# Patient Record
Sex: Female | Born: 1995 | Hispanic: No | Marital: Single | Smoking: Never smoker
Health system: Southern US, Community
[De-identification: ages and names within clinical notes are randomized; demographics above are authoritative.]

## PROBLEM LIST (undated history)

## (undated) HISTORY — PX: MANDIBLE FRACTURE SURGERY: SHX706

---

## 2019-12-20 ENCOUNTER — Emergency Department (HOSPITAL_COMMUNITY): Payer: BC Managed Care – PPO

## 2019-12-20 ENCOUNTER — Other Ambulatory Visit: Payer: Self-pay

## 2019-12-20 ENCOUNTER — Encounter (HOSPITAL_COMMUNITY): Payer: Self-pay | Admitting: Emergency Medicine

## 2019-12-20 ENCOUNTER — Emergency Department (HOSPITAL_COMMUNITY)
Admission: EM | Admit: 2019-12-20 | Discharge: 2019-12-20 | Disposition: A | Discharge status: Home / Self Care | Payer: BC Managed Care – PPO | Attending: Emergency Medicine | Admitting: Emergency Medicine

## 2019-12-20 DIAGNOSIS — Z20822 Contact with and (suspected) exposure to covid-19: Secondary | ICD-10-CM | POA: Insufficient documentation

## 2019-12-20 DIAGNOSIS — E860 Dehydration: Secondary | ICD-10-CM | POA: Insufficient documentation

## 2019-12-20 DIAGNOSIS — R55 Syncope and collapse: Secondary | ICD-10-CM

## 2019-12-20 LAB — BASIC METABOLIC PANEL
Anion gap: 8 (ref 5–15)
BUN: 10 mg/dL (ref 6–20)
CO2: 24 mmol/L (ref 22–32)
Calcium: 9.5 mg/dL (ref 8.9–10.3)
Chloride: 108 mmol/L (ref 98–111)
Creatinine, Ser: 0.86 mg/dL (ref 0.44–1.00)
GFR calc Af Amer: >60 mL/min (ref >60)
GFR calc non Af Amer: >60 mL/min (ref >60)
Glucose, Bld: 87 mg/dL (ref 70–99)
Potassium: 3.8 mmol/L (ref 3.5–5.1)
Sodium: 140 mmol/L (ref 135–145)

## 2019-12-20 LAB — CBC
HCT: 44.3 % (ref 36.0–46.0)
Hemoglobin: 14.7 g/dL (ref 12.0–15.0)
MCH: 29.2 pg (ref 26.0–34.0)
MCHC: 33.2 g/dL (ref 30.0–36.0)
MCV: 87.9 fL (ref 80.0–100.0)
Platelets: 300 10*3/uL (ref 150–400)
RBC: 5.04 MIL/uL (ref 3.87–5.11)
RDW: 12.7 % (ref 11.5–15.5)
WBC: 8.3 10*3/uL (ref 4.0–10.5)
nRBC: 0 % (ref 0.0–0.2)

## 2019-12-20 LAB — URINALYSIS, ROUTINE W REFLEX MICROSCOPIC
Bilirubin Urine: NEGATIVE
Glucose, UA: NEGATIVE mg/dL
Hgb urine dipstick: NEGATIVE
Ketones, ur: NEGATIVE mg/dL
Leukocytes,Ua: NEGATIVE
Nitrite: NEGATIVE
Protein, ur: NEGATIVE mg/dL
Specific Gravity, Urine: 1.021 (ref 1.005–1.030)
pH: 7 (ref 5.0–8.0)

## 2019-12-20 LAB — HEPATIC FUNCTION PANEL
ALT: 12 U/L (ref 0–44)
AST: 19 U/L (ref 15–41)
Albumin: 3.9 g/dL (ref 3.5–5.0)
Alkaline Phosphatase: 54 U/L (ref 38–126)
Bilirubin, Direct: <0.1 mg/dL (ref 0.0–0.2)
Total Bilirubin: 0.6 mg/dL (ref 0.3–1.2)
Total Protein: 6.3 g/dL — ABNORMAL LOW (ref 6.5–8.1)

## 2019-12-20 LAB — I-STAT BETA HCG BLOOD, ED (MC, WL, AP ONLY): I-stat hCG, quantitative: <5 m[IU]/mL (ref <5)

## 2019-12-20 LAB — LIPASE, BLOOD: Lipase: 33 U/L (ref 11–51)

## 2019-12-20 LAB — POC SARS CORONAVIRUS 2 AG -  ED: SARS Coronavirus 2 Ag: NEGATIVE

## 2019-12-20 MED ORDER — SODIUM CHLORIDE 0.9% FLUSH: 3.0000 mL | Freq: Once | INTRAVENOUS | Status: DC

## 2019-12-20 MED ORDER — SODIUM CHLORIDE 0.9 % IV BOLUS
1000.0000 mL | Freq: Once | INTRAVENOUS | Status: AC
  Administered 2019-12-20: 19:00:00 1000 mL via INTRAVENOUS

## 2019-12-20 NOTE — ED Notes (Signed)
Pt discharge education provided. COVID education provided. Pt verbalizes understanding. Pt is alert and oriented x4 and ambulatory at discharge.

## 2019-12-20 NOTE — ED Notes (Signed)
Ambulated pt with pulse ox. Pt SpO2 decreased to 98% RA. Pt denies discomfort.

## 2019-12-20 NOTE — Discharge Instructions (Addendum)
You have been diagnosed today with Dehydration, Near Syncope.  At this time there does not appear to be the presence of an emergent medical condition, however there is always the potential for conditions to change. Please read and follow the below instructions.  Please return to the Emergency Department immediately for any new or worsening symptoms. Please be sure to follow up with your Primary Care Provider within one week regarding your visit today; please call their office to schedule an appointment even if you are feeling better for a follow-up visit.  Call the number for [facility] on discharge paperwork to establish a primary care provider if you do not already have one. Please drink plenty of water to avoid dehydration and eat healthy foods.  Please get plenty of rest. Your Covid test is currently pending, you may review your results on your MyChart account in 1-2 days.  Discussed the results with your primary care provider at your follow-up visit this week.  Get help right away if you: Have a seizure. Have pain in your: Chest. Belly (abdomen). Back. Faint once or more than once. Have a very bad headache. Are bleeding from your mouth or butt. Have black or tarry poop (stool). Have a very fast or uneven heartbeat (palpitations). Are mixed up (confused). Have trouble walking. Are very weak. Have trouble seeing.  Get help right away if you have: Any symptoms of very bad dehydration. Symptoms of vomiting, such as: You cannot eat or drink without vomiting. Your vomiting gets worse or does not go away. Your vomit has blood or green stuff in it. Symptoms that get worse with treatment. A fever. A very bad headache. Problems with peeing or pooping (having a bowel movement), such as: Watery poop that gets worse or does not go away. Blood in your poop (stool). This may cause poop to look black and tarry. Not peeing in 6-8 hours. Peeing only a small  amount of very dark pee in 6-8 hours. Trouble breathing. You have any new/concerning or worsening of symptoms  Please read the additional information packets attached to your discharge summary.  Do not take your medicine if  develop an itchy rash, swelling in your mouth or lips, or difficulty breathing; call 911 and seek immediate emergency medical attention if this occurs.  Note: Portions of this text may have been transcribed using voice recognition software. Every effort was made to ensure accuracy; however, inadvertent computerized transcription errors may still be present.

## 2019-12-20 NOTE — ED Triage Notes (Signed)
Pt in POV. Reports N/V/D X1 week, began having syncopal episodes 2 nights ago. Pt states she does not remember "passing out" States her family is who witnessed it. Reports being tested for COVID 2 days ago, received negative results today. VSS. A/OX4.

## 2019-12-20 NOTE — ED Provider Notes (Signed)
[facility] EMERGENCY DEPARTMENT Provider Note   CSN: [id number] Arrival date & time: 12/20/19  1619     History Chief Complaint  Patient presents with  . Loss of Consciousness    Ebony Soto is a 24 y.o. female otherwise healthy no daily medication use.  Patient reports that 7 days ago she developed nausea with nonbloody/nonbilious emesis and nonbloody diarrhea.  Vomiting gradually improved over 1-2 days and has not recurred in the last 5.  She reports she remains mildly nauseous and has had decreased eating and drinking over the past 1 week.  She reports that she has had several episodes of nonbloody diarrhea daily for the last 7 days but feels it has greatly improved over the last 2 days and has only had one small amount of diarrhea today.  She reports that 2 days ago she was laying on the couch when she got up quickly felt lightheaded and needed to lay down, she denies complete loss of consciousness and feels that she remained awake throughout the event and after lying on the ground a moment felt improved.  She reports that yesterday she had another episode of lightheadedness when she went from sitting to standing but did not need to lay down or feel like she was going to lose consciousness from this.  She reports that the symptoms have not reoccurred today.  She reports that she was tested for Covid 2 days ago through an outpatient clinic and was negative.  She denies any fall/injury, headache/vision changes, confusion, speech difficulty, sore throat, cough, chest pain/shortness of breath, abdominal pain, numbness/weakness, tingling, dysuria/hematuria dizziness or any additional concerns.  HPI     History reviewed. No pertinent past medical history.  There are no problems to display for this patient.   Past Surgical History:  Procedure Laterality Date  . MANDIBLE FRACTURE SURGERY       OB History   No obstetric history on file.     No family  history on file.  Social History   Tobacco Use  . Smoking status: Never Smoker  . Smokeless tobacco: Never Used  Substance Use Topics  . Alcohol use: Not Currently  . Drug use: Not Currently    Home Medications Prior to Admission medications   Not on File    Allergies    Patient has no known allergies.  Review of Systems   Review of Systems Ten systems are reviewed and are negative for acute change except as noted in the HPI  Physical Exam Updated Vital Signs BP 124/78   Pulse 88   Temp 97.6 F (36.4 C) (Oral)   Resp 17   Ht 5\' 9"  (1.753 m)   Wt 68 kg   SpO2 98%   BMI 22.15 kg/m   Physical Exam Constitutional:      General: She is not in acute distress.    Appearance: Normal appearance. She is well-developed. She is not ill-appearing or diaphoretic.  HENT:     Head: Normocephalic and atraumatic.     Right Ear: External ear normal.     Left Ear: External ear normal.     Nose: Nose normal.  Eyes:     General: Vision grossly intact. Gaze aligned appropriately.     Pupils: Pupils are equal, round, and reactive to light.  Neck:     Trachea: Trachea and phonation normal. No tracheal deviation.  Cardiovascular:     Rate and Rhythm: Normal rate and regular rhythm.  Pulses: Normal pulses.     Heart sounds: Normal heart sounds.  Pulmonary:     Effort: Pulmonary effort is normal. No respiratory distress.     Breath sounds: Normal breath sounds.  Abdominal:     General: There is no distension.     Palpations: Abdomen is soft.     Tenderness: There is no abdominal tenderness. There is no guarding or rebound.  Musculoskeletal:        General: Normal range of motion.     Cervical back: Normal range of motion.  Skin:    General: Skin is warm and dry.  Neurological:     Mental Status: She is alert.     GCS: GCS eye subscore is 4. GCS verbal subscore is 5. GCS motor subscore is 6.     Comments: Speech is clear and goal oriented, follows commands Major Cranial  nerves without deficit, no facial droop Normal strength in upper and lower extremities bilaterally including dorsiflexion and plantar flexion, strong and equal grip strength Sensation normal to light and sharp touch Moves extremities without ataxia, coordination intact Normal finger to nose and rapid alternating movements Neg romberg, no pronator drift Normal gait Normal heel-shin and balance  Psychiatric:        Behavior: Behavior normal.    ED Results / Procedures / Treatments   Labs (all labs ordered are listed, but only abnormal results are displayed) Labs Reviewed  HEPATIC FUNCTION PANEL - Abnormal; Notable for the following components:      Result Value   Total Protein 6.3 (*)    All other components within normal limits  SARS CORONAVIRUS 2 (TAT 6-24 HRS)  BASIC METABOLIC PANEL  CBC  URINALYSIS, ROUTINE W REFLEX MICROSCOPIC  LIPASE, BLOOD  CBG MONITORING, ED  I-STAT BETA HCG BLOOD, ED ([facility], [facility], [facility] ONLY)  POC SARS CORONAVIRUS 2 AG -  ED    EKG EKG Interpretation  Date/Time:  Sunday December 20 2019 16:23:06 EST Ventricular Rate:  100 PR Interval:  144 QRS Duration: 86 QT Interval:  340 QTC Calculation: 438 R Axis:   -37 Text Interpretation: Normal sinus rhythm Left axis deviation Abnormal ECG No previous ECGs available Confirmed by Yao, David H ([id number]) on 12/20/2019 7:43:47 PM   Radiology DG Chest Portable 1 View  Result Date: 12/20/2019 CLINICAL DATA:  Lightheaded presyncope EXAM: PORTABLE CHEST 1 VIEW COMPARISON:  None. FINDINGS: The heart size and mediastinal contours are within normal limits. Both lungs are clear. The visualized skeletal structures are unremarkable. IMPRESSION: No active disease. Electronically Signed   By: Bindu  Avutu M.D.   On: 12/20/2019 20:51    Procedures Procedures (including critical care time)  Medications Ordered in ED Medications  sodium chloride flush (NS) 0.9 % injection 3 mL (has no administration in time range)  sodium  chloride 0.9 % bolus 1,000 mL (0 mLs Intravenous Stopped 12/20/19 2001)    ED Course  I have reviewed the triage vital signs and the nursing notes.  Pertinent labs & imaging results that were available during my care of the patient were reviewed by me and considered in my medical decision making (see chart for details).    MDM Rules/Calculators/A&P                     23  year old otherwise healthy female presents today for lightheadedness and near syncope after going from a laying to a standing position 2 days ago and lightheadedness without near syncope when going from a sitting  position to standing yesterday.  The symptoms have not reoccurred today and appear that patient has been experiencing nausea vomiting diarrhea and decreased p.o. intake for the last 7 days.  Her GI symptoms she reports have been improving over the last several days, no vomiting 5 days and only 1 episode of small amount of diarrhea today.  She has no preceding chest pain or shortness of breath.  Cranial nerves intact, heart regular rate and rhythm without murmur, lungs clear bilaterally, abdomen soft nontender without peritoneal signs, neurovascular intact to all 4 extremities without evidence of DVT.  Vital signs are stable on arrival without fever, tachycardia, hypotension or hypoxia on room air.  Patient's symptoms today are likely due to orthostatic hypotension in the setting of decreased p.o. intake and GI illness this week.  Will obtain basic lab work, chest x-ray and EKG, give fluid bolus and obtain orthostatic vital signs and ambulatory pulse ox. - Pregnancy test negative CBC within normal limits BMP within normal limit Urinalysis negative LFTs nonacute, protein 6.3 otherwise within normal limits Lipase within normal limits Rapid Covid negative Chest x-ray:  IMPRESSION:  No active disease.   EKG:  Normal sinus rhythm Left axis deviation Abnormal ECG No previous ECGs available Confirmed by Wandra Arthurs [phone]) on  12/20/2019 7:43:47 PM - Orthostatics obtained and were negative.  Patient was ambulated by nursing staff without hypoxia or difficulty on room air.  As above suspect patient was orthostatic due to decreased p.o. intake and GI symptoms over the past week. No recurrence of symptoms in the ER she was given a fluid bolus.  Her symptoms appear to be resolving and there is no indication for further work-up or any imaging at this time.  I encouraged her to increase her p.o. intake at home and follow-up with her PCP for recheck in the next week.  She is aware to follow-up on her outpatient Covid test on her MyChart account and to discuss the results in 1-2 days with her primary care provider.  At this time there does not appear to be any evidence of an acute emergency medical condition and the patient appears stable for discharge with appropriate outpatient follow up. Diagnosis was discussed with patient who verbalizes understanding of care plan and is agreeable to discharge. I have discussed return precautions with patient who verbalizes understanding of return precautions. Patient encouraged to follow-up with their PCP. All questions answered.  Patient's case discussed with Dr. Darl Householder who agrees with plan to discharge with PCP follow-up.   Note: Portions of this report may have been transcribed using voice recognition software. Every effort was made to ensure accuracy; however, inadvertent computerized transcription errors may still be present. Final Clinical Impression(s) / ED Diagnoses Final diagnoses:  Dehydration  Near syncope    Rx / DC Orders ED Discharge Orders    None       Gari Crown 12/20/19 2220    Drenda Freeze, MD 12/20/19 [phone]

## 2019-12-21 LAB — SARS CORONAVIRUS 2 (TAT 6-24 HRS): SARS Coronavirus 2: NEGATIVE

## 2020-07-31 IMAGING — DX DG CHEST 1V PORT
1 series · 1 of 1 positions shown · non-contrast
Comparison: None.

CLINICAL DATA: Lightheaded presyncope

EXAM:
PORTABLE CHEST 1 VIEW

[chest ap]
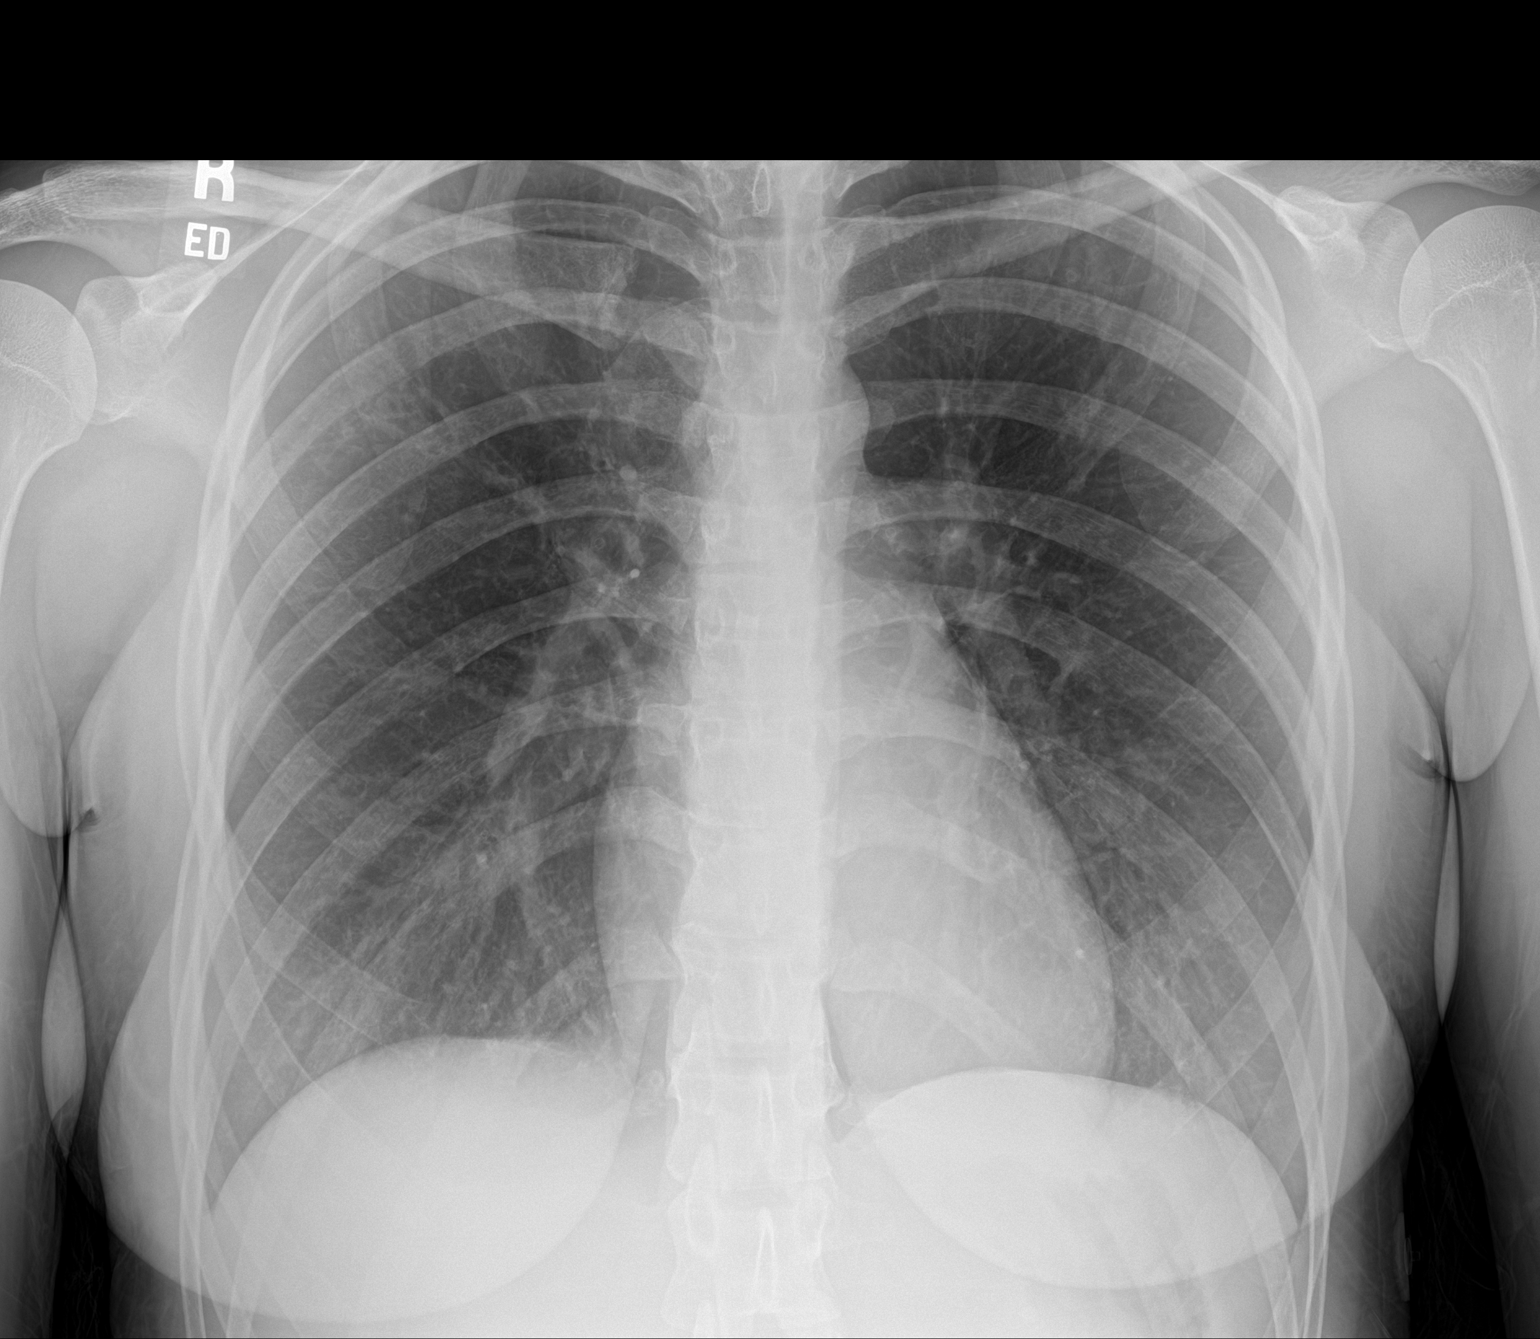

[1 of 1 positions shown; findings below may reference images not displayed]

FINDINGS: The heart size and mediastinal contours are within normal limits.
Both lungs are clear. The visualized skeletal structures are
unremarkable.
IMPRESSION: No active disease.
